# Patient Record
Sex: Female | Born: 1960 | Race: White | Hispanic: No | Smoking: Never smoker
Health system: Southern US, Community
[De-identification: ages and names within clinical notes are randomized; demographics above are authoritative.]

## PROBLEM LIST (undated history)

## (undated) DIAGNOSIS — N926 Irregular menstruation, unspecified: Secondary | ICD-10-CM

## (undated) DIAGNOSIS — E785 Hyperlipidemia, unspecified: Secondary | ICD-10-CM

## (undated) HISTORY — DX: Irregular menstruation, unspecified: N92.6

## (undated) HISTORY — DX: Hyperlipidemia, unspecified: E78.5

## (undated) HISTORY — PX: TONSILLECTOMY AND ADENOIDECTOMY: SHX28

## (undated) HISTORY — PX: REDUCTION MAMMAPLASTY: SUR839

## (undated) HISTORY — PX: BREAST LUMPECTOMY: SHX2

## (undated) HISTORY — PX: INCISIONAL BREAST BIOPSY: SHX1812

---

## 1998-06-13 ENCOUNTER — Other Ambulatory Visit: Admission: RE | Admit: 1998-06-13 | Discharge: 1998-06-13 | Payer: Self-pay | Admitting: Obstetrics & Gynecology

## 1999-05-21 ENCOUNTER — Ambulatory Visit (HOSPITAL_COMMUNITY): Admission: AD | Admit: 1999-05-21 | Discharge: 1999-05-21 | Payer: Self-pay | Admitting: Obstetrics & Gynecology

## 1999-05-21 ENCOUNTER — Encounter (INDEPENDENT_AMBULATORY_CARE_PROVIDER_SITE_OTHER): Payer: Self-pay | Admitting: Specialist

## 1999-06-11 ENCOUNTER — Other Ambulatory Visit: Admission: RE | Admit: 1999-06-11 | Discharge: 1999-06-11 | Payer: Self-pay | Admitting: Obstetrics & Gynecology

## 1999-08-06 ENCOUNTER — Encounter: Payer: Self-pay | Admitting: Obstetrics and Gynecology

## 1999-08-06 ENCOUNTER — Encounter: Admission: RE | Admit: 1999-08-06 | Discharge: 1999-08-06 | Payer: Self-pay | Admitting: Obstetrics and Gynecology

## 1999-09-23 ENCOUNTER — Encounter: Admission: RE | Admit: 1999-09-23 | Discharge: 1999-09-23 | Payer: Self-pay | Admitting: Obstetrics and Gynecology

## 1999-09-23 ENCOUNTER — Encounter: Payer: Self-pay | Admitting: Obstetrics and Gynecology

## 1999-11-08 ENCOUNTER — Encounter: Admission: RE | Admit: 1999-11-08 | Discharge: 1999-11-08 | Payer: Self-pay | Admitting: Gastroenterology

## 1999-11-08 ENCOUNTER — Encounter: Payer: Self-pay | Admitting: Gastroenterology

## 1999-11-11 ENCOUNTER — Encounter: Admission: RE | Admit: 1999-11-11 | Discharge: 1999-11-11 | Payer: Self-pay | Admitting: Gastroenterology

## 1999-11-11 ENCOUNTER — Encounter: Payer: Self-pay | Admitting: Gastroenterology

## 2000-05-04 ENCOUNTER — Other Ambulatory Visit: Admission: RE | Admit: 2000-05-04 | Discharge: 2000-05-04 | Payer: Self-pay | Admitting: Obstetrics and Gynecology

## 2000-08-25 ENCOUNTER — Encounter: Admission: RE | Admit: 2000-08-25 | Discharge: 2000-08-25 | Payer: Self-pay | Admitting: Obstetrics and Gynecology

## 2000-08-25 ENCOUNTER — Encounter: Payer: Self-pay | Admitting: Obstetrics and Gynecology

## 2001-03-12 ENCOUNTER — Encounter: Payer: Self-pay | Admitting: Obstetrics and Gynecology

## 2001-03-12 ENCOUNTER — Encounter: Admission: RE | Admit: 2001-03-12 | Discharge: 2001-03-12 | Payer: Self-pay | Admitting: Obstetrics and Gynecology

## 2001-04-20 ENCOUNTER — Ambulatory Visit (HOSPITAL_BASED_OUTPATIENT_CLINIC_OR_DEPARTMENT_OTHER): Admission: RE | Admit: 2001-04-20 | Discharge: 2001-04-20 | Payer: Self-pay | Admitting: *Deleted

## 2001-04-20 ENCOUNTER — Encounter (INDEPENDENT_AMBULATORY_CARE_PROVIDER_SITE_OTHER): Payer: Self-pay | Admitting: *Deleted

## 2001-07-21 ENCOUNTER — Encounter: Payer: Self-pay | Admitting: Obstetrics and Gynecology

## 2001-07-21 ENCOUNTER — Encounter: Admission: RE | Admit: 2001-07-21 | Discharge: 2001-07-21 | Payer: Self-pay | Admitting: Obstetrics and Gynecology

## 2001-08-24 ENCOUNTER — Other Ambulatory Visit: Admission: RE | Admit: 2001-08-24 | Discharge: 2001-08-24 | Payer: Self-pay | Admitting: Obstetrics and Gynecology

## 2001-09-10 ENCOUNTER — Ambulatory Visit (HOSPITAL_COMMUNITY): Admission: RE | Admit: 2001-09-10 | Discharge: 2001-09-10 | Payer: Self-pay | Admitting: Obstetrics and Gynecology

## 2001-09-10 ENCOUNTER — Encounter: Payer: Self-pay | Admitting: Obstetrics and Gynecology

## 2001-11-17 ENCOUNTER — Encounter: Payer: Self-pay | Admitting: Obstetrics and Gynecology

## 2001-11-17 ENCOUNTER — Encounter: Admission: RE | Admit: 2001-11-17 | Discharge: 2001-11-17 | Payer: Self-pay | Admitting: Obstetrics and Gynecology

## 2002-05-25 ENCOUNTER — Other Ambulatory Visit: Admission: RE | Admit: 2002-05-25 | Discharge: 2002-05-25 | Payer: Self-pay | Admitting: Obstetrics and Gynecology

## 2002-09-21 ENCOUNTER — Encounter: Admission: RE | Admit: 2002-09-21 | Discharge: 2002-09-21 | Payer: Self-pay | Admitting: Internal Medicine

## 2002-09-21 ENCOUNTER — Encounter: Payer: Self-pay | Admitting: Internal Medicine

## 2002-10-20 ENCOUNTER — Other Ambulatory Visit: Admission: RE | Admit: 2002-10-20 | Discharge: 2002-10-20 | Payer: Self-pay | Admitting: Gynecology

## 2003-06-17 ENCOUNTER — Emergency Department (HOSPITAL_COMMUNITY): Admission: EM | Admit: 2003-06-17 | Discharge: 2003-06-17 | Payer: Self-pay | Admitting: Emergency Medicine

## 2003-07-06 ENCOUNTER — Encounter: Admission: RE | Admit: 2003-07-06 | Discharge: 2003-07-06 | Payer: Self-pay | Admitting: Obstetrics and Gynecology

## 2003-08-15 ENCOUNTER — Encounter: Admission: RE | Admit: 2003-08-15 | Discharge: 2003-08-15 | Payer: Self-pay | Admitting: Obstetrics and Gynecology

## 2003-09-19 ENCOUNTER — Other Ambulatory Visit: Admission: RE | Admit: 2003-09-19 | Discharge: 2003-09-19 | Payer: Self-pay | Admitting: Obstetrics and Gynecology

## 2003-10-06 ENCOUNTER — Ambulatory Visit (HOSPITAL_BASED_OUTPATIENT_CLINIC_OR_DEPARTMENT_OTHER): Admission: RE | Admit: 2003-10-06 | Discharge: 2003-10-06 | Payer: Self-pay | Admitting: *Deleted

## 2003-10-06 ENCOUNTER — Encounter (INDEPENDENT_AMBULATORY_CARE_PROVIDER_SITE_OTHER): Payer: Self-pay | Admitting: *Deleted

## 2003-10-06 ENCOUNTER — Ambulatory Visit (HOSPITAL_COMMUNITY): Admission: RE | Admit: 2003-10-06 | Discharge: 2003-10-06 | Payer: Self-pay | Admitting: *Deleted

## 2004-07-19 ENCOUNTER — Encounter: Admission: RE | Admit: 2004-07-19 | Discharge: 2004-07-19 | Payer: Self-pay | Admitting: Gynecology

## 2004-07-26 ENCOUNTER — Encounter: Admission: RE | Admit: 2004-07-26 | Discharge: 2004-07-26 | Payer: Self-pay | Admitting: Gynecology

## 2004-07-29 ENCOUNTER — Encounter: Admission: RE | Admit: 2004-07-29 | Discharge: 2004-07-29 | Payer: Self-pay | Admitting: Gynecology

## 2004-10-24 ENCOUNTER — Ambulatory Visit: Payer: Self-pay | Admitting: Internal Medicine

## 2004-10-28 ENCOUNTER — Ambulatory Visit: Payer: Self-pay | Admitting: Internal Medicine

## 2004-11-20 ENCOUNTER — Other Ambulatory Visit: Admission: RE | Admit: 2004-11-20 | Discharge: 2004-11-20 | Payer: Self-pay | Admitting: Obstetrics and Gynecology

## 2005-01-14 ENCOUNTER — Encounter: Admission: RE | Admit: 2005-01-14 | Discharge: 2005-01-14 | Payer: Self-pay | Admitting: Gynecology

## 2005-07-22 ENCOUNTER — Encounter: Admission: RE | Admit: 2005-07-22 | Discharge: 2005-07-22 | Payer: Self-pay | Admitting: Internal Medicine

## 2005-10-21 ENCOUNTER — Other Ambulatory Visit: Admission: RE | Admit: 2005-10-21 | Discharge: 2005-10-21 | Payer: Self-pay | Admitting: Obstetrics and Gynecology

## 2005-11-19 ENCOUNTER — Ambulatory Visit: Payer: Self-pay | Admitting: Internal Medicine

## 2005-12-02 ENCOUNTER — Ambulatory Visit: Payer: Self-pay | Admitting: Internal Medicine

## 2005-12-12 ENCOUNTER — Encounter: Admission: RE | Admit: 2005-12-12 | Discharge: 2005-12-12 | Payer: Self-pay | Admitting: Obstetrics and Gynecology

## 2006-10-27 ENCOUNTER — Ambulatory Visit: Payer: Self-pay | Admitting: Internal Medicine

## 2006-10-27 LAB — CONVERTED CEMR LAB
ALT: 18 units/L (ref 0–40)
AST: 23 units/L (ref 0–37)
Albumin: 4.6 g/dL (ref 3.5–5.2)
Alkaline Phosphatase: 38 units/L — ABNORMAL LOW (ref 39–117)
Amylase: 76 units/L (ref 27–131)
Basophils Absolute: 0.1 10*3/uL (ref 0.0–0.1)
Basophils Relative: 1.1 % — ABNORMAL HIGH (ref 0.0–1.0)
Bilirubin, Direct: 0.1 mg/dL (ref 0.0–0.3)
Eosinophils Absolute: 0.1 10*3/uL (ref 0.0–0.6)
Eosinophils Relative: 2.2 % (ref 0.0–5.0)
Free T4: 0.7 ng/dL (ref 0.6–1.6)
HCT: 43.8 % (ref 36.0–46.0)
Hemoglobin: 14.8 g/dL (ref 12.0–15.0)
Lipase: 34 units/L (ref 11.0–59.0)
Lymphocytes Relative: 30.3 % (ref 12.0–46.0)
MCHC: 33.7 g/dL (ref 30.0–36.0)
MCV: 96.7 fL (ref 78.0–100.0)
Monocytes Absolute: 0.2 10*3/uL (ref 0.2–0.7)
Monocytes Relative: 4.5 % (ref 3.0–11.0)
Neutro Abs: 2.9 10*3/uL (ref 1.4–7.7)
Neutrophils Relative %: 61.9 % (ref 43.0–77.0)
Platelets: 259 10*3/uL (ref 150–400)
RBC: 4.53 M/uL (ref 3.87–5.11)
RDW: 12.6 % (ref 11.5–14.6)
T3, Free: 3.6 pg/mL (ref 2.3–4.2)
TSH: 1.34 microintl units/mL (ref 0.35–5.50)
Total Bilirubin: 0.7 mg/dL (ref 0.3–1.2)
Total Protein: 7.4 g/dL (ref 6.0–8.3)
WBC: 4.7 10*3/uL (ref 4.5–10.5)

## 2006-11-02 ENCOUNTER — Encounter: Admission: RE | Admit: 2006-11-02 | Discharge: 2006-11-02 | Payer: Self-pay | Admitting: Obstetrics and Gynecology

## 2006-11-06 ENCOUNTER — Encounter: Admission: RE | Admit: 2006-11-06 | Discharge: 2006-11-06 | Payer: Self-pay | Admitting: Internal Medicine

## 2006-12-02 ENCOUNTER — Ambulatory Visit: Payer: Self-pay | Admitting: Internal Medicine

## 2007-10-15 ENCOUNTER — Telehealth (INDEPENDENT_AMBULATORY_CARE_PROVIDER_SITE_OTHER): Payer: Self-pay | Admitting: *Deleted

## 2007-10-19 ENCOUNTER — Ambulatory Visit: Payer: Self-pay | Admitting: Internal Medicine

## 2007-11-27 IMAGING — US US ABDOMEN COMPLETE
1 series · 14 of 25 positions shown · non-contrast
Comparison: none

CLINICAL DATA: Mid to lower abdominal pain.
 ABDOMEN ULTRASOUND:
TECHNIQUE: Complete abdominal ultrasound examination was performed including evaluation of the liver, gallbladder, bile ducts, pancreas, kidneys, spleen, IVC, and abdominal aorta.
 The gallbladder is well seen and no gallstones are noted.  The liver has a normal echogenic pattern.  The common bile duct is normal measuring 3.5 mm in diameter.  The IVC, pancreas, and spleen are normal.  No hydronephrosis is noted.  The right kidney measures 13.2 cm sagittally with the left kidney measuring 12.7 cm.  A small echogenic focus in the upper pole of the right kidney is consistent with an 8 mm angiomyolipoma.  The abdominal aorta is normal in caliber.

[Series 1: unknown · 0.20mm/px · 14 of 95 slices shown]
[im 1/95]
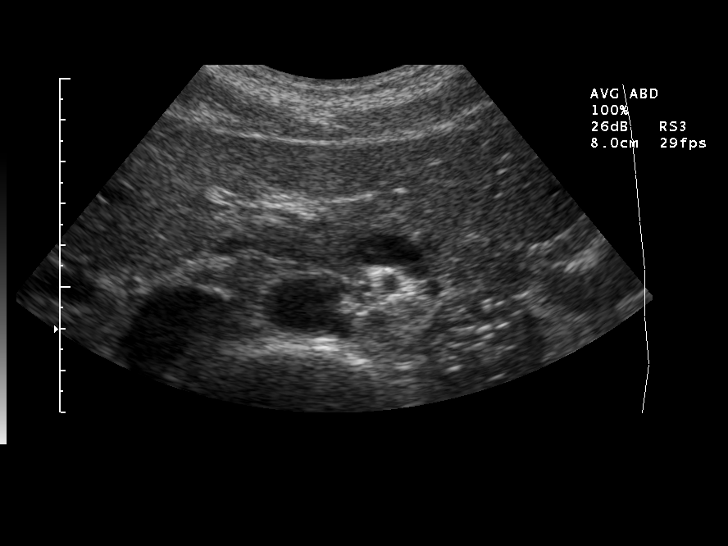
[im 8/95]
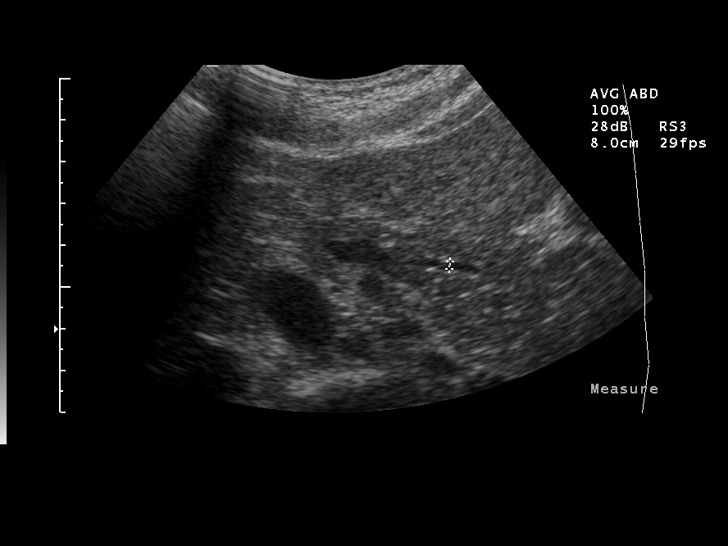
[im 16/95]
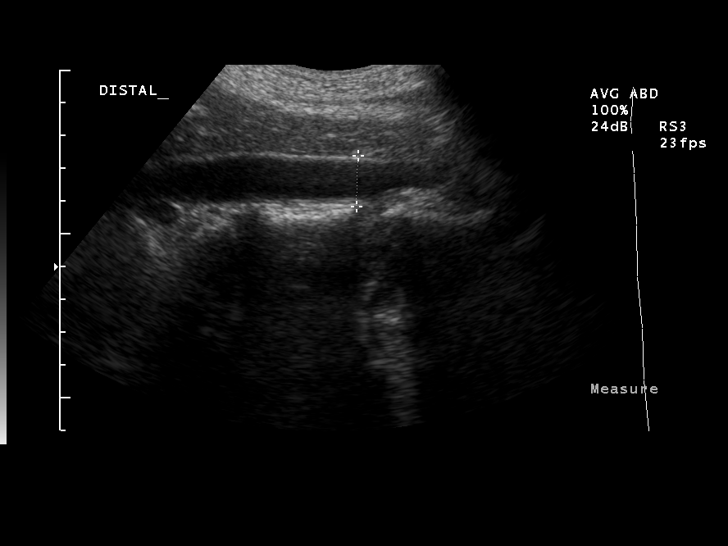
[im 24/95]
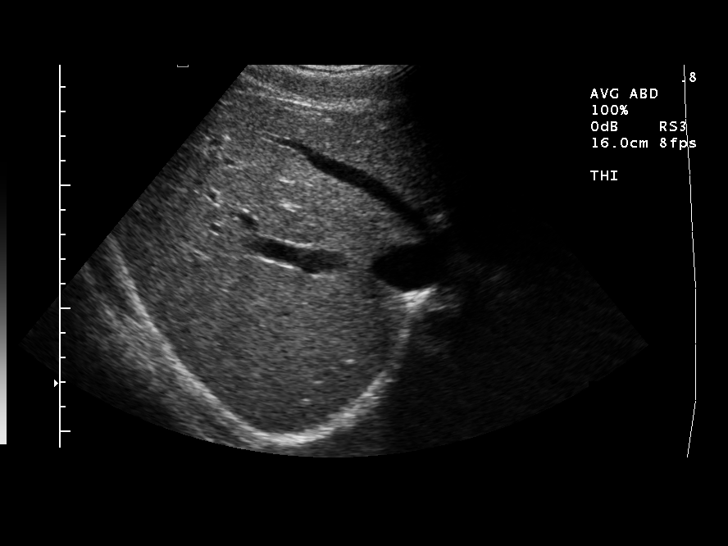
[im 32/95]
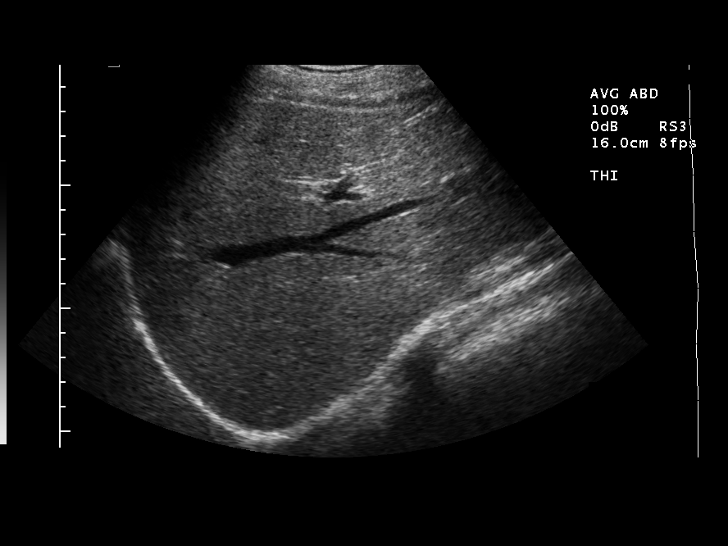
[im 36/95]
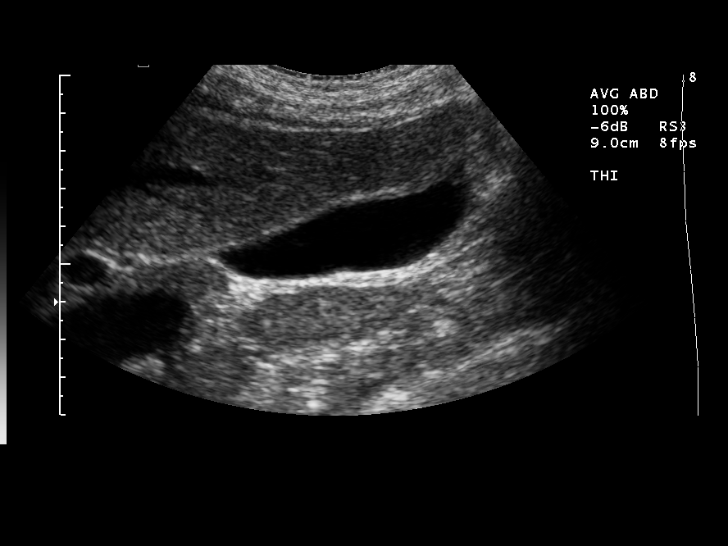
[im 44/95]
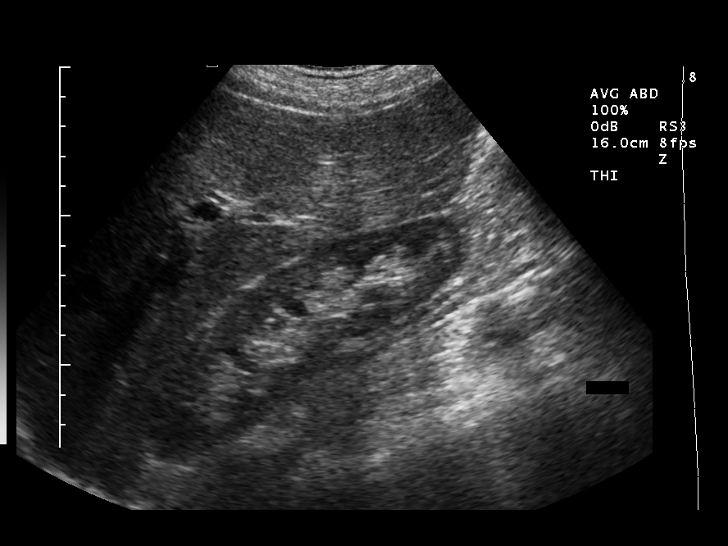
[im 51/95]
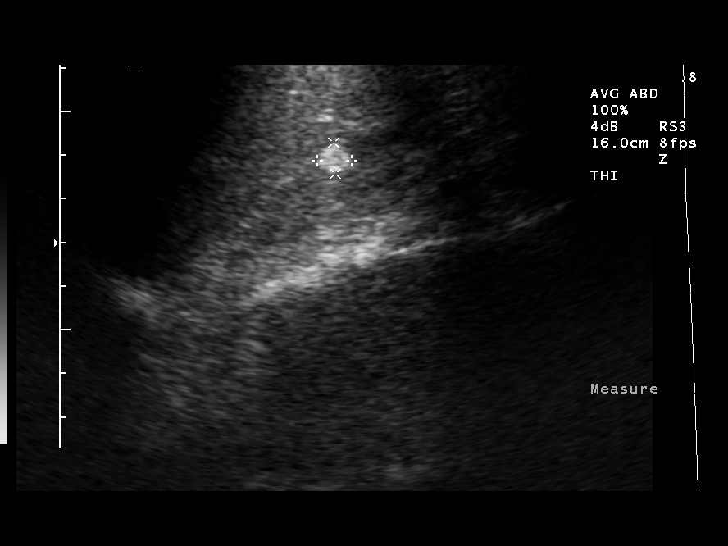
[im 59/95]
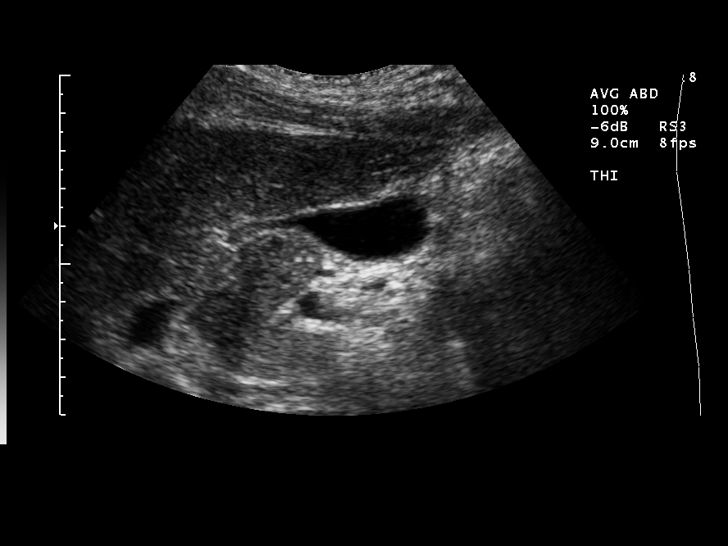
[im 63/95]
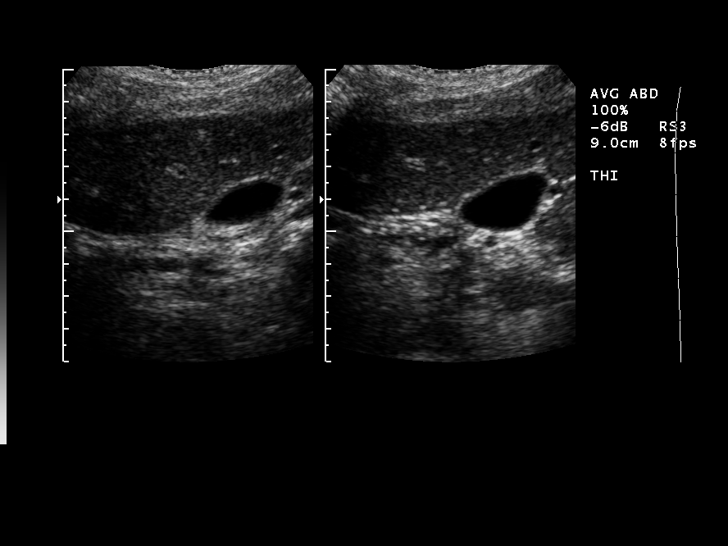
[im 71/95]
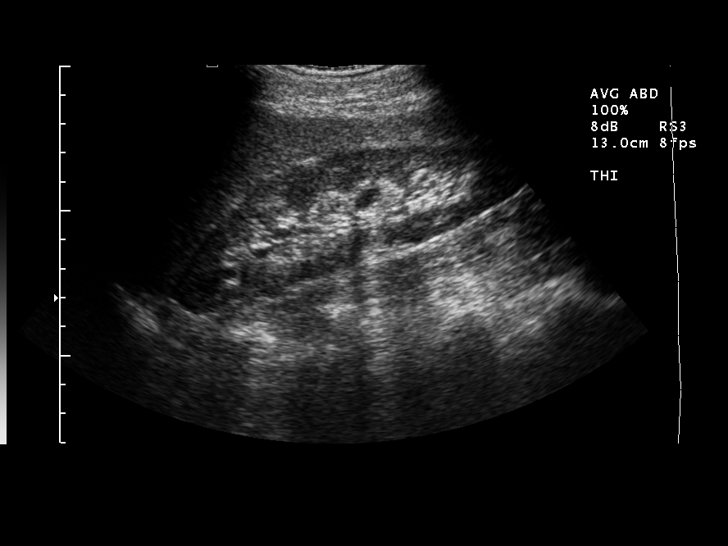
[im 79/95]
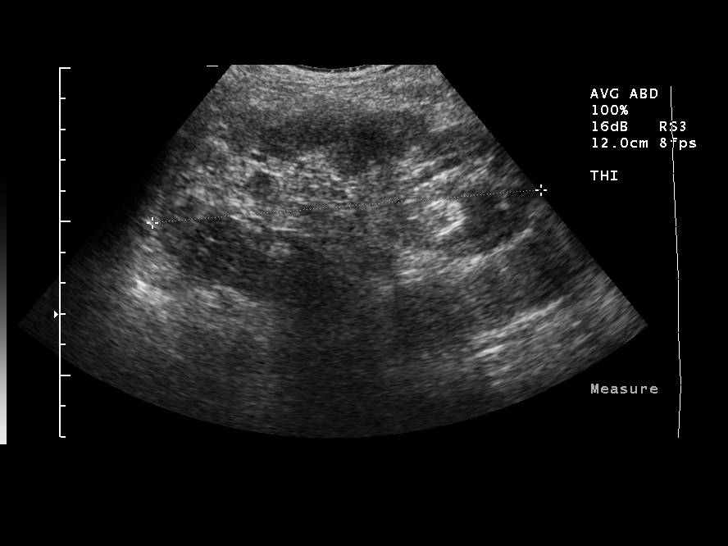
[im 87/95]
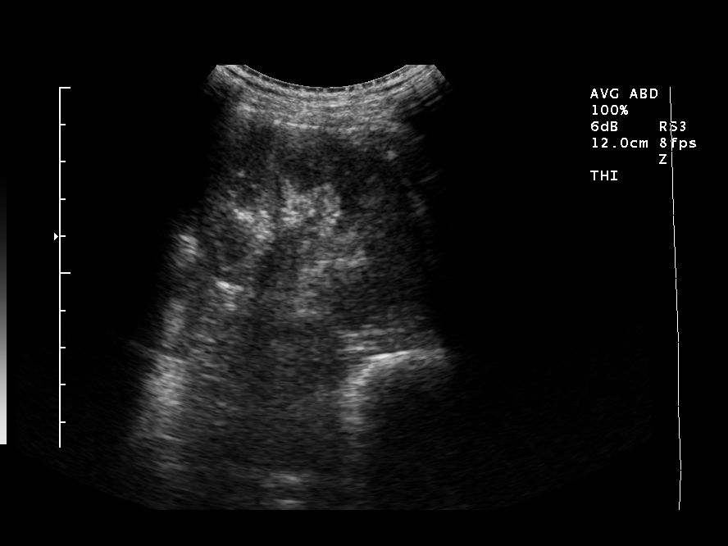
[im 95/95]
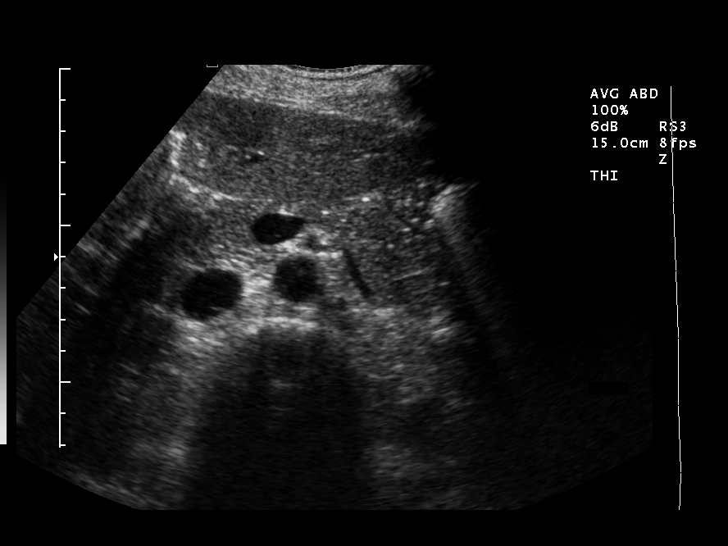

[14 of 25 positions shown; findings below may reference images not displayed]

IMPRESSION: 1.  No gallstones.
 2.  8 mm right upper pole renal angiomyolipoma.

## 2007-12-03 ENCOUNTER — Telehealth (INDEPENDENT_AMBULATORY_CARE_PROVIDER_SITE_OTHER): Payer: Self-pay | Admitting: *Deleted

## 2007-12-14 ENCOUNTER — Ambulatory Visit: Payer: Self-pay | Admitting: Internal Medicine

## 2007-12-14 DIAGNOSIS — Z9089 Acquired absence of other organs: Secondary | ICD-10-CM

## 2007-12-14 DIAGNOSIS — N926 Irregular menstruation, unspecified: Secondary | ICD-10-CM

## 2007-12-14 DIAGNOSIS — N939 Abnormal uterine and vaginal bleeding, unspecified: Secondary | ICD-10-CM

## 2007-12-14 DIAGNOSIS — Z9189 Other specified personal risk factors, not elsewhere classified: Secondary | ICD-10-CM | POA: Insufficient documentation

## 2007-12-14 LAB — CONVERTED CEMR LAB
Bilirubin Urine: NEGATIVE
Glucose, Urine, Semiquant: NEGATIVE
Ketones, urine, test strip: NEGATIVE
Nitrite: NEGATIVE
Protein, U semiquant: NEGATIVE
Specific Gravity, Urine: 1.01
Urobilinogen, UA: NEGATIVE
WBC Urine, dipstick: NEGATIVE
pH: 5

## 2008-02-10 ENCOUNTER — Encounter: Admission: RE | Admit: 2008-02-10 | Discharge: 2008-02-10 | Payer: Self-pay | Admitting: Obstetrics and Gynecology

## 2008-03-08 ENCOUNTER — Ambulatory Visit: Payer: Self-pay | Admitting: Internal Medicine

## 2008-03-08 DIAGNOSIS — E785 Hyperlipidemia, unspecified: Secondary | ICD-10-CM

## 2008-03-15 ENCOUNTER — Encounter (INDEPENDENT_AMBULATORY_CARE_PROVIDER_SITE_OTHER): Payer: Self-pay | Admitting: *Deleted

## 2008-04-06 ENCOUNTER — Other Ambulatory Visit: Admission: RE | Admit: 2008-04-06 | Discharge: 2008-04-06 | Payer: Self-pay | Admitting: Obstetrics and Gynecology

## 2008-07-18 ENCOUNTER — Telehealth (INDEPENDENT_AMBULATORY_CARE_PROVIDER_SITE_OTHER): Payer: Self-pay | Admitting: *Deleted

## 2009-01-29 ENCOUNTER — Telehealth (INDEPENDENT_AMBULATORY_CARE_PROVIDER_SITE_OTHER): Payer: Self-pay | Admitting: *Deleted

## 2009-01-31 ENCOUNTER — Telehealth (INDEPENDENT_AMBULATORY_CARE_PROVIDER_SITE_OTHER): Payer: Self-pay | Admitting: *Deleted

## 2009-02-06 ENCOUNTER — Ambulatory Visit: Payer: Self-pay | Admitting: Internal Medicine

## 2009-02-06 DIAGNOSIS — L509 Urticaria, unspecified: Secondary | ICD-10-CM

## 2009-02-07 ENCOUNTER — Telehealth: Payer: Self-pay | Admitting: Internal Medicine

## 2009-02-08 ENCOUNTER — Telehealth (INDEPENDENT_AMBULATORY_CARE_PROVIDER_SITE_OTHER): Payer: Self-pay | Admitting: *Deleted

## 2009-08-21 ENCOUNTER — Telehealth: Payer: Self-pay | Admitting: Internal Medicine

## 2009-12-13 ENCOUNTER — Other Ambulatory Visit: Admission: RE | Admit: 2009-12-13 | Discharge: 2009-12-13 | Payer: Self-pay | Admitting: Obstetrics and Gynecology

## 2009-12-18 ENCOUNTER — Emergency Department (HOSPITAL_COMMUNITY): Admission: EM | Admit: 2009-12-18 | Discharge: 2009-12-18 | Payer: Self-pay | Admitting: Emergency Medicine

## 2009-12-21 ENCOUNTER — Emergency Department (HOSPITAL_COMMUNITY): Admission: EM | Admit: 2009-12-21 | Discharge: 2009-12-21 | Payer: Self-pay | Admitting: Emergency Medicine

## 2010-09-28 ENCOUNTER — Encounter: Payer: Self-pay | Admitting: Gynecology

## 2010-09-29 ENCOUNTER — Encounter: Payer: Self-pay | Admitting: Orthopedic Surgery

## 2010-09-30 ENCOUNTER — Encounter: Payer: Self-pay | Admitting: Obstetrics and Gynecology

## 2010-10-06 LAB — CONVERTED CEMR LAB
ALT: 16 units/L (ref 0–35)
AST: 23 units/L (ref 0–37)
Albumin: 4.4 g/dL (ref 3.5–5.2)
Alkaline Phosphatase: 47 units/L (ref 39–117)
BUN: 11 mg/dL (ref 6–23)
Basophils Absolute: 0 10*3/uL (ref 0.0–0.1)
Basophils Relative: 0.1 % (ref 0.0–1.0)
Bilirubin, Direct: 0.1 mg/dL (ref 0.0–0.3)
CO2: 29 meq/L (ref 19–32)
Calcium: 9.2 mg/dL (ref 8.4–10.5)
Chloride: 103 meq/L (ref 96–112)
Cholesterol, target level: 200 mg/dL
Cholesterol: 201 mg/dL (ref 0–200)
Creatinine, Ser: 0.6 mg/dL (ref 0.4–1.2)
Direct LDL: 85.8 mg/dL
Eosinophils Absolute: 0.1 10*3/uL (ref 0.0–0.7)
Eosinophils Relative: 2.4 % (ref 0.0–5.0)
GFR calc Af Amer: 138 mL/min
GFR calc non Af Amer: 114 mL/min
Glucose, Bld: 85 mg/dL (ref 70–99)
HCT: 41.2 % (ref 36.0–46.0)
HDL goal, serum: 50 mg/dL
HDL: 103.2 mg/dL (ref >39.0)
Hemoglobin: 14.5 g/dL (ref 12.0–15.0)
LDL Goal: 150 mg/dL
Lymphocytes Relative: 30.3 % (ref 12.0–46.0)
MCHC: 35.1 g/dL (ref 30.0–36.0)
MCV: 97.8 fL (ref 78.0–100.0)
Monocytes Absolute: 0.4 10*3/uL (ref 0.1–1.0)
Monocytes Relative: 10 % (ref 3.0–12.0)
Neutro Abs: 2.9 10*3/uL (ref 1.4–7.7)
Neutrophils Relative %: 57.2 % (ref 43.0–77.0)
Platelets: 236 10*3/uL (ref 150–400)
Potassium: 3.8 meq/L (ref 3.5–5.1)
RBC: 4.21 M/uL (ref 3.87–5.11)
RDW: 12.8 % (ref 11.5–14.6)
Sodium: 139 meq/L (ref 135–145)
TSH: 0.75 microintl units/mL (ref 0.35–5.50)
Total Bilirubin: 0.9 mg/dL (ref 0.3–1.2)
Total CHOL/HDL Ratio: 1.9
Total Protein: 7.1 g/dL (ref 6.0–8.3)
Triglycerides: 59 mg/dL (ref 0–149)
VLDL: 12 mg/dL (ref 0–40)
WBC: 5 10*3/uL (ref 4.5–10.5)

## 2010-10-17 ENCOUNTER — Other Ambulatory Visit: Payer: Self-pay | Admitting: Internal Medicine

## 2010-10-17 DIAGNOSIS — Z1231 Encounter for screening mammogram for malignant neoplasm of breast: Secondary | ICD-10-CM

## 2010-10-23 ENCOUNTER — Ambulatory Visit
Admission: RE | Admit: 2010-10-23 | Discharge: 2010-10-23 | Disposition: A | Discharge status: Home / Self Care | Payer: BC Managed Care – PPO | Source: Ambulatory Visit | Attending: Internal Medicine | Admitting: Internal Medicine

## 2010-10-23 DIAGNOSIS — Z1231 Encounter for screening mammogram for malignant neoplasm of breast: Secondary | ICD-10-CM

## 2010-10-29 ENCOUNTER — Other Ambulatory Visit: Payer: Self-pay | Admitting: Internal Medicine

## 2010-10-29 DIAGNOSIS — R928 Other abnormal and inconclusive findings on diagnostic imaging of breast: Secondary | ICD-10-CM

## 2010-11-01 ENCOUNTER — Encounter: Payer: Self-pay | Admitting: Internal Medicine

## 2010-11-02 ENCOUNTER — Encounter: Payer: Self-pay | Admitting: Internal Medicine

## 2010-11-04 ENCOUNTER — Other Ambulatory Visit: Payer: Self-pay | Admitting: Internal Medicine

## 2010-11-04 ENCOUNTER — Ambulatory Visit
Admission: RE | Admit: 2010-11-04 | Discharge: 2010-11-04 | Disposition: A | Discharge status: Home / Self Care | Payer: BC Managed Care – PPO | Source: Ambulatory Visit | Attending: Internal Medicine | Admitting: Internal Medicine

## 2010-11-04 ENCOUNTER — Other Ambulatory Visit: Payer: BC Managed Care – PPO

## 2010-11-04 DIAGNOSIS — R928 Other abnormal and inconclusive findings on diagnostic imaging of breast: Secondary | ICD-10-CM

## 2010-11-04 DIAGNOSIS — N6320 Unspecified lump in the left breast, unspecified quadrant: Secondary | ICD-10-CM

## 2010-11-05 ENCOUNTER — Encounter: Payer: Self-pay | Admitting: Internal Medicine

## 2010-11-05 ENCOUNTER — Other Ambulatory Visit: Payer: Self-pay | Admitting: Radiology

## 2010-11-05 ENCOUNTER — Ambulatory Visit
Admission: RE | Admit: 2010-11-05 | Discharge: 2010-11-05 | Disposition: A | Discharge status: Home / Self Care | Payer: BC Managed Care – PPO | Source: Ambulatory Visit | Attending: Internal Medicine | Admitting: Internal Medicine

## 2010-11-05 DIAGNOSIS — R928 Other abnormal and inconclusive findings on diagnostic imaging of breast: Secondary | ICD-10-CM

## 2010-11-05 DIAGNOSIS — N6320 Unspecified lump in the left breast, unspecified quadrant: Secondary | ICD-10-CM

## 2010-12-17 ENCOUNTER — Ambulatory Visit (INDEPENDENT_AMBULATORY_CARE_PROVIDER_SITE_OTHER): Payer: BC Managed Care – PPO | Admitting: Internal Medicine

## 2010-12-17 ENCOUNTER — Encounter: Payer: Self-pay | Admitting: Internal Medicine

## 2010-12-17 DIAGNOSIS — E785 Hyperlipidemia, unspecified: Secondary | ICD-10-CM

## 2010-12-17 DIAGNOSIS — Z0000 Encounter for general adult medical examination without abnormal findings: Secondary | ICD-10-CM

## 2010-12-17 DIAGNOSIS — L509 Urticaria, unspecified: Secondary | ICD-10-CM

## 2010-12-17 NOTE — Assessment & Plan Note (Addendum)
Due to high HDL. NMR Lipoprofile 2008: LDL 122 (1060/656), HDL 122, TG 55. LDL goal = < 150, ideally < 120.

## 2010-12-17 NOTE — Assessment & Plan Note (Signed)
?   Due to cashews

## 2010-12-17 NOTE — Patient Instructions (Addendum)
Preventive Health Care: Exercise  30-45  minutes a day, 3-4 days a week. Walking is especially valuable in preventing Osteoporosis. Eat a low-fat diet with lots of fruits and vegetables, up to 7-9 servings per day. Avoid obesity; your goal = waist less than 35 inches.Consume less than 30 grams of sugar per day from foods & drinks with High Fructose Corn Syrup as #2,3 or #4 on label. Eye Doctor - have an eye exam @ least annually. Depression is common in our stressful world. If you're feeling down or losing interest in things you normally enjoy, please call. Please schedule fasting labs:BMET, CBC & dif, hepatic panel, TSH, Lipid panel( Codes: V70.0, 272.4)

## 2010-12-17 NOTE — Progress Notes (Signed)
  Subjective:    Patient ID: Morgan Burch, female    DOB: 1961/02/25, 50 y.o.   MRN: [id number]  HPI Morgan Burch is here for a physical; she has had a benign lesion resected from L breast recently.    Review of Systems Patient reports no  hearing  changes, adenopathy,fever, weight change,  persistant / recurrent hoarseness , swallowing issues, chest pain,palpitations,edema,persistant /recurrent cough, hemoptysis, dyspnea( rest/ exertional/paroxysmal nocturnal), gastrointestinal bleeding(melena, rectal bleeding), abdominal pain, significant heartburn bowel changes,GU symptoms(dysuria, hematuria,pyuria ), Gyn symptoms(abnormal  bleeding , pain),  syncope, focal weakness, memory loss,numbness & tingling, skin/hair /nail changes,abnormal bruising or bleeding, anxiety,or depression. Some visual acuity changes.  Intermittent low back pain ; responsive to soaks.     Objective:   Physical Exam Gen: Thin but  well-nourished in appearance. Alert, appropriate and cooperative throughout exam. Head: Normocephalic without obvious abnormalities. Eyes: No corneal or conjunctival inflammation noted. Pupils equal round reactive to light and accommodation. Fundal exam is benign without hemorrhages, exudate, papilledema. Extraocular motion intact. Vision grossly normal. Ears: External  ear exam reveals no significant lesions or deformities. Canals  clear.TMs normal. Hearing is grossly normal bilaterally. Nose: External nasal exam reveals no deformity or inflammation. Nasal mucosa are pink and moist. No lesions or exudates noted. Septum  w/o deviation.  Mouth: Oral mucosa and oropharynx reveal no lesions or exudates. Teeth in good repair. Neck: No deformities, masses, or tenderness noted. Range of motion normal. Thyroid normal w/o nodules or enlargement. Lungs: Normal respiratory effort; chest expands symmetrically. Lungs are clear to auscultation without rales, wheezes, or increased work of breathing. Heart: Normal  rate and rhythm. Normal S1 and S2. No gallop, click, or rub. No  murmur. Abdomen: Bowel sounds normal; abdomen soft and nontender. No masses, organomegaly or hernias noted. Musculoskeletal/extremities: No deformity or scoliosis noted of  the thoracic or lumbar spine. No clubbing, cyanosis, edema, or deformity noted. Range of motion normal .Tone & strength  None .Joints none . Nail health normal.. Vascular: Carotid, radial artery, dorsalis pedis and dorsalis posterior tibial pulses are full and equal. No bruits present. Neurologic: Alert and oriented x3. Deep tendon reflexes symmetrical and normal.  Skin: Intact without suspicious lesions or rashes.  Lymph: No cervical or  axillary  lymphadenopathy present.  Psych: Mood and affect are normal; no evidence of anxiety or depression.          Assessment & Plan:  #1 Wellness Exam , no new issues

## 2010-12-19 ENCOUNTER — Encounter: Payer: Self-pay | Admitting: Internal Medicine

## 2010-12-20 ENCOUNTER — Other Ambulatory Visit (INDEPENDENT_AMBULATORY_CARE_PROVIDER_SITE_OTHER): Payer: BC Managed Care – PPO

## 2010-12-20 DIAGNOSIS — Z0000 Encounter for general adult medical examination without abnormal findings: Secondary | ICD-10-CM

## 2010-12-20 DIAGNOSIS — E785 Hyperlipidemia, unspecified: Secondary | ICD-10-CM

## 2010-12-20 LAB — HEPATIC FUNCTION PANEL
ALT: 16 U/L (ref 0–35)
AST: 21 U/L (ref 0–37)
Albumin: 4.3 g/dL (ref 3.5–5.2)
Total Bilirubin: 0.4 mg/dL (ref 0.3–1.2)
Total Protein: 6.7 g/dL (ref 6.0–8.3)

## 2010-12-20 LAB — BASIC METABOLIC PANEL
BUN: 11 mg/dL (ref 6–23)
Chloride: 102 mEq/L (ref 96–112)
Potassium: 4.8 mEq/L (ref 3.5–5.1)
Sodium: 141 mEq/L (ref 135–145)

## 2010-12-20 LAB — CBC WITH DIFFERENTIAL/PLATELET
Basophils Relative: 0.6 % (ref 0.0–3.0)
Eosinophils Relative: 4.7 % (ref 0.0–5.0)
HCT: 41.8 % (ref 36.0–46.0)
Hemoglobin: 14.2 g/dL (ref 12.0–15.0)
Lymphs Abs: 1.8 10*3/uL (ref 0.7–4.0)
MCV: 98.4 fl (ref 78.0–100.0)
Monocytes Absolute: 0.4 10*3/uL (ref 0.1–1.0)
Monocytes Relative: 8.9 % (ref 3.0–12.0)
Neutro Abs: 2.3 10*3/uL (ref 1.4–7.7)
Platelets: 235 10*3/uL (ref 150.0–400.0)
WBC: 4.8 10*3/uL (ref 4.5–10.5)

## 2010-12-20 LAB — TSH: TSH: 1.23 u[IU]/mL (ref 0.35–5.50)

## 2010-12-20 LAB — LIPID PANEL: Cholesterol: 214 mg/dL — ABNORMAL HIGH (ref 0–200)

## 2010-12-27 ENCOUNTER — Telehealth: Payer: Self-pay | Admitting: *Deleted

## 2010-12-27 NOTE — Telephone Encounter (Signed)
Pt called would like for Hop to give her a call received copy of labs and noticed that lymphocytes has increased within the last five years and is concerned.

## 2011-01-14 ENCOUNTER — Emergency Department (HOSPITAL_COMMUNITY): Payer: BC Managed Care – PPO

## 2011-01-14 ENCOUNTER — Emergency Department (HOSPITAL_COMMUNITY)
Admission: EM | Admit: 2011-01-14 | Discharge: 2011-01-14 | Disposition: A | Discharge status: Home / Self Care | Payer: BC Managed Care – PPO | Attending: Emergency Medicine | Admitting: Emergency Medicine

## 2011-01-14 DIAGNOSIS — S20229A Contusion of unspecified back wall of thorax, initial encounter: Secondary | ICD-10-CM | POA: Insufficient documentation

## 2011-01-14 DIAGNOSIS — M25579 Pain in unspecified ankle and joints of unspecified foot: Secondary | ICD-10-CM | POA: Insufficient documentation

## 2011-01-14 DIAGNOSIS — S93409A Sprain of unspecified ligament of unspecified ankle, initial encounter: Secondary | ICD-10-CM | POA: Insufficient documentation

## 2011-01-14 DIAGNOSIS — M542 Cervicalgia: Secondary | ICD-10-CM | POA: Insufficient documentation

## 2011-01-14 DIAGNOSIS — R0602 Shortness of breath: Secondary | ICD-10-CM | POA: Insufficient documentation

## 2011-01-14 DIAGNOSIS — S335XXA Sprain of ligaments of lumbar spine, initial encounter: Secondary | ICD-10-CM | POA: Insufficient documentation

## 2011-01-14 DIAGNOSIS — M549 Dorsalgia, unspecified: Secondary | ICD-10-CM | POA: Insufficient documentation

## 2011-01-14 DIAGNOSIS — M546 Pain in thoracic spine: Secondary | ICD-10-CM | POA: Insufficient documentation

## 2011-01-14 DIAGNOSIS — Y92009 Unspecified place in unspecified non-institutional (private) residence as the place of occurrence of the external cause: Secondary | ICD-10-CM | POA: Insufficient documentation

## 2011-01-14 DIAGNOSIS — W108XXA Fall (on) (from) other stairs and steps, initial encounter: Secondary | ICD-10-CM | POA: Insufficient documentation

## 2011-01-14 DIAGNOSIS — M545 Low back pain, unspecified: Secondary | ICD-10-CM | POA: Insufficient documentation

## 2011-01-14 DIAGNOSIS — R079 Chest pain, unspecified: Secondary | ICD-10-CM | POA: Insufficient documentation

## 2011-01-21 NOTE — Assessment & Plan Note (Signed)
[facility]                                 ON-CALL NOTE   NAME:Morgan Burch, Morgan Burch                  MRN:          [id number]  DATE:01/27/2009                            DOB:          [birth year]    Time 3:55 pm She sees Dr. Alwyn Ren. Phone#  [phone]  The patient states that she has had chickenpox for the last 7 days and  asks for my advice.  She has never been particularly sick with it.  She  has had no cough, no fever what started as a few itchy small bumps on  her body is now generalized over most of her body.  She has been taking  Benadryl for the itching and did not think there was anything else to  do.  She spoke to her pharmacist who suggested she call her doctor.  I  told her in fact it was a little too late for any type of antiviral  treatment to be effective.  She could continue Benadryl and she could  use Aveeno soaps in the bathtub for itching.  She can follow up as  needed.     Tera Mater. Clent Ridges, MD  Electronically Signed    SAF/MedQ  DD: 01/27/2009  DT: 01/28/2009  Job #: [id number]

## 2011-01-24 NOTE — Op Note (Signed)
[place]  Patient:    Morgan Burch, Morgan Burch                      MRN: [id number] Proc. Date: 04/20/01 Attending:  Vikki Ports, M.D.                           Operative Report  PREOPERATIVE DIAGNOSIS: Left breast mass, status post reduction mammoplasty.  POSTOPERATIVE DIAGNOSIS: Left breast mass, status post reduction mammoplasty.  OPERATION/PROCEDURE: Excisional left breast biopsy.  SURGEON: Vikki Ports, M.D.  ANESTHESIA: Local MAC.  DESCRIPTION OF PROCEDURE: The patient was taken to the operating room and placed in the supine position.  After adequate anesthesia was induced using MAC technique the left breast was prepped and draped in the normal sterile fashion.  Using the previous vertical incision extending from 6 oclock in the periareolar region I dissected inferior and laterally onto palpable firm nodularity.  This was excised in its entirety and sent for pathologic evaluation.  There were other areas of fibrous tissue but no more nodularity. Adequate hemostasis was assured.  The skin was closed with a 4-0 subcuticular Monocryl and Steri-Strips and a sterile dressing applied.  The patient tolerated the procedure well and went to the PACU in good condition. DD:  04/20/01 TD:  04/20/01 Job: [id number]

## 2011-01-24 NOTE — Op Note (Signed)
NAME:  JENNYLEE, UEHARA                       ACCOUNT NO.:  [id number]   MEDICAL RECORD NO.:  [id number]                   PATIENT TYPE:  AMB   LOCATION:  DSC                                  FACILITY:  [facility]   PHYSICIAN:  Vikki Ports, M.D.         DATE OF BIRTH:  [birth year]   DATE OF PROCEDURE:  10/06/2003  DATE OF DISCHARGE:                                 OPERATIVE REPORT   PREOPERATIVE DIAGNOSIS:  Left breast mass.   POSTOPERATIVE DIAGNOSIS:  Left breast mass.   PROCEDURE:  Excisional left breast biopsy.   SURGEON:  Vikki Ports, M.D.   DESCRIPTION OF PROCEDURE:  The patient was taken to the operating room and  placed in a supine position.  After adequate anesthesia was induced using  MAC technique, the left breast was prepped and draped in the normal sterile  fashion.  Using 1% lidocaine local anesthesia, the skin overlying the mass  in the 6 o'clock periareolar region was anesthetized.  Using the previous  reduction mammoplasty incision, a vertical incision was made to the areola.  I dissected down through the subcutaneous tissue onto a rather fibrotic  scarred mass.  This was rather ill defined but excised in its entirety and  sent for pathologic evaluation.  Adequate hemostasis was insured.  The skin  was closed with subcuticular 4-0 Monocryl.  Steri-Strips and sterile  dressings were applied.  The patient tolerated the procedure well and went  to the PACU in good condition.                                               Vikki Ports, M.D.    KRH/MEDQ  D:  10/06/2003  T:  10/06/2003  Job:  [id number]

## 2011-03-30 ENCOUNTER — Emergency Department (HOSPITAL_COMMUNITY): Payer: BC Managed Care – PPO

## 2011-03-30 ENCOUNTER — Emergency Department (HOSPITAL_COMMUNITY)
Admission: EM | Admit: 2011-03-30 | Discharge: 2011-03-30 | Disposition: A | Discharge status: Home / Self Care | Payer: BC Managed Care – PPO | Attending: Emergency Medicine | Admitting: Emergency Medicine

## 2011-03-30 DIAGNOSIS — F1010 Alcohol abuse, uncomplicated: Secondary | ICD-10-CM | POA: Insufficient documentation

## 2011-03-30 DIAGNOSIS — R079 Chest pain, unspecified: Secondary | ICD-10-CM | POA: Insufficient documentation

## 2011-03-30 DIAGNOSIS — S20219A Contusion of unspecified front wall of thorax, initial encounter: Secondary | ICD-10-CM | POA: Insufficient documentation

## 2011-03-30 DIAGNOSIS — M25539 Pain in unspecified wrist: Secondary | ICD-10-CM | POA: Insufficient documentation

## 2011-03-30 DIAGNOSIS — S42213A Unspecified displaced fracture of surgical neck of unspecified humerus, initial encounter for closed fracture: Secondary | ICD-10-CM | POA: Insufficient documentation

## 2011-03-30 DIAGNOSIS — M25519 Pain in unspecified shoulder: Secondary | ICD-10-CM | POA: Insufficient documentation

## 2011-03-30 LAB — ETHANOL: Alcohol, Ethyl (B): 133 mg/dL — ABNORMAL HIGH (ref 0–11)

## 2011-04-02 ENCOUNTER — Other Ambulatory Visit: Payer: Self-pay | Admitting: Internal Medicine

## 2011-04-02 DIAGNOSIS — N62 Hypertrophy of breast: Secondary | ICD-10-CM

## 2011-04-02 DIAGNOSIS — R922 Inconclusive mammogram: Secondary | ICD-10-CM

## 2011-07-14 ENCOUNTER — Ambulatory Visit
Admission: RE | Admit: 2011-07-14 | Discharge: 2011-07-14 | Disposition: A | Discharge status: Home / Self Care | Payer: BC Managed Care – PPO | Source: Ambulatory Visit | Attending: Internal Medicine | Admitting: Internal Medicine

## 2011-07-14 DIAGNOSIS — N62 Hypertrophy of breast: Secondary | ICD-10-CM

## 2011-07-14 DIAGNOSIS — R922 Inconclusive mammogram: Secondary | ICD-10-CM

## 2011-07-22 ENCOUNTER — Other Ambulatory Visit: Payer: Self-pay | Admitting: Internal Medicine

## 2011-07-22 DIAGNOSIS — N6459 Other signs and symptoms in breast: Secondary | ICD-10-CM

## 2011-08-18 ENCOUNTER — Encounter: Payer: Self-pay | Admitting: Internal Medicine

## 2011-08-18 ENCOUNTER — Ambulatory Visit (INDEPENDENT_AMBULATORY_CARE_PROVIDER_SITE_OTHER): Payer: BC Managed Care – PPO | Admitting: Internal Medicine

## 2011-08-18 ENCOUNTER — Ambulatory Visit: Payer: BC Managed Care – PPO | Admitting: Internal Medicine

## 2011-08-18 ENCOUNTER — Encounter: Payer: Self-pay | Admitting: Neurology

## 2011-08-18 DIAGNOSIS — Z01118 Encounter for examination of ears and hearing with other abnormal findings: Secondary | ICD-10-CM

## 2011-08-18 DIAGNOSIS — H9319 Tinnitus, unspecified ear: Secondary | ICD-10-CM

## 2011-08-18 DIAGNOSIS — R42 Dizziness and giddiness: Secondary | ICD-10-CM

## 2011-08-18 DIAGNOSIS — H9390 Unspecified disorder of ear, unspecified ear: Secondary | ICD-10-CM
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
## 2011-08-18 NOTE — Patient Instructions (Signed)
Please keep a diary of symptoms and signs. Enter significant symptoms, frequency, severity ( 1-10 scale), possible triggers, and response to positional changes or other therapeutic intervention as discussed.

## 2011-09-29 ENCOUNTER — Ambulatory Visit: Payer: BC Managed Care – PPO | Admitting: Neurology

## 2011-10-02 ENCOUNTER — Telehealth: Payer: Self-pay | Admitting: Genetic Counselor

## 2011-10-07 NOTE — Telephone Encounter (Signed)
Left message

## 2011-10-15 ENCOUNTER — Ambulatory Visit
Admission: RE | Admit: 2011-10-15 | Discharge: 2011-10-15 | Disposition: A | Discharge status: Home / Self Care | Payer: BC Managed Care – PPO | Source: Ambulatory Visit | Attending: Internal Medicine | Admitting: Internal Medicine

## 2011-10-15 ENCOUNTER — Other Ambulatory Visit: Payer: Self-pay | Admitting: Internal Medicine

## 2011-10-15 DIAGNOSIS — N6459 Other signs and symptoms in breast: Secondary | ICD-10-CM

## 2011-10-19 ENCOUNTER — Encounter: Payer: Self-pay | Admitting: Internal Medicine

## 2011-10-19 DIAGNOSIS — Z86711 Personal history of pulmonary embolism: Secondary | ICD-10-CM | POA: Insufficient documentation

## 2011-10-19 DIAGNOSIS — Z86718 Personal history of other venous thrombosis and embolism: Secondary | ICD-10-CM | POA: Insufficient documentation

## 2011-10-19 DIAGNOSIS — Z8759 Personal history of other complications of pregnancy, childbirth and the puerperium: Secondary | ICD-10-CM | POA: Insufficient documentation

## 2011-10-22 ENCOUNTER — Ambulatory Visit: Payer: No Typology Code available for payment source | Admitting: Internal Medicine

## 2011-10-22 ENCOUNTER — Encounter: Payer: Self-pay | Admitting: Internal Medicine

## 2011-10-22 ENCOUNTER — Ambulatory Visit (INDEPENDENT_AMBULATORY_CARE_PROVIDER_SITE_OTHER): Payer: BC Managed Care – PPO | Admitting: Internal Medicine

## 2011-10-22 DIAGNOSIS — Z86711 Personal history of pulmonary embolism: Secondary | ICD-10-CM

## 2011-10-22 DIAGNOSIS — R042 Hemoptysis: Secondary | ICD-10-CM

## 2011-10-22 DIAGNOSIS — J209 Acute bronchitis, unspecified: Secondary | ICD-10-CM

## 2011-10-22 DIAGNOSIS — J069 Acute upper respiratory infection, unspecified: Secondary | ICD-10-CM

## 2011-10-22 DIAGNOSIS — Z86718 Personal history of other venous thrombosis and embolism: Secondary | ICD-10-CM

## 2011-10-22 MED ORDER — CLARITHROMYCIN ER 500 MG PO TB24: 1000.0000 mg | ORAL_TABLET | Freq: Every day | ORAL | Status: AC

## 2011-10-22 MED ORDER — HYDROCODONE-HOMATROPINE 5-1.5 MG/5ML PO SYRP: 5.0000 mL | ORAL_SOLUTION | Freq: Four times a day (QID) | ORAL | Status: AC | PRN

## 2011-10-22 NOTE — Progress Notes (Signed)
  Subjective:    Patient ID: Morgan Burch, female    DOB: 03-05-1961, 51 y.o.   MRN: [id number]  [facility]: Onset: 2/9  Trigger:no Course:dry initially; green sputum by 2/12 Treatment/efficacy:Allegra D, Mucinex DM  With minimal bendefit Associated symptoms/signs:  URI symptoms: No facial pain, frontal headaches,  dental pain.Clear - yellow nasal discharge; some sore hroat. No ear ache/otic discharge Extrinsic symptoms: No itchy eyes, sneezing, or angioedema Infectious symptoms: No fever, chills, sweats Chest symptoms: Some pleuritic pain. Streaky hemoptysis in purulent secretions. 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 The present symptoms do not mimic those events.         Review of Systems she does not have any insomnia or  significant sleep issues other than exogenous stress. The cough is disturbing her rest at night.     Objective:   Physical Exam General appearance:thin but in good health ;well nourished; no acute distress or increased work of breathing is present.  Some cervical lymphadenopathy w/o axillary LA.   Eyes: No conjunctival inflammation or lid edema is present. .  Ears:  External ear exam shows no significant lesions or deformities.  Otoscopic examination reveals clear canals, tympanic membranes are intact bilaterally without bulging, retraction, inflammation or discharge.  Nose:  External nasal examination shows no deformity or inflammation. Nasal mucosa are dry without lesions or exudates. No septal dislocation or deviation.No obstruction to airflow.   Oral exam: Dental hygiene is good; lips and gums are healthy appearing.There is no oropharyngeal erythema or exudate noted.     Heart:  Normal rate and regular rhythm. S1 and S2 normal without gallop, murmur, click, rub S 4 Lungs:Chest clear to auscultation; no wheezes, rhonchi,rales ,or rubs present.No increased work of breathing. Harsh paroxysmal cough with thick grayish green secretions  Extremities:  No cyanosis, edema, or clubbing  noted . Homans sign is negative   Skin: Warm & dry w/o jaundice or tenting.          Assessment & Plan:  #1 bronchitis, acute with streaky hemoptysis in purulent secretions. No reactive airways findings on exam  #2 upper respiratory tract infection without criteria for frank rhinosinusitis  #3 past medical history of deep venous diagnosis of pulmonary emboli; no clinical evidence of recurrence. Coagulopathy workup will be completed  Plan: See orders and recommendations

## 2011-10-22 NOTE — Patient Instructions (Signed)
Plain Mucinex for thick secretions ;force NON dairy fluids. Use a Neti pot daily as needed for sinus congestion Nose:  External nasal examination shows no deformity or inflammation. Nasal mucosa are pink and moist without lesions or exudates. No septal dislocation or dislocation.No obstruction to airflow.
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
## 2012-06-08 HISTORY — PX: TOTAL HIP ARTHROPLASTY: SHX124

## 2012-07-28 ENCOUNTER — Other Ambulatory Visit: Payer: Self-pay

## 2012-07-28 NOTE — Telephone Encounter (Signed)
Pt LMOVM requesting refill on Oxycodone and other meds stating had a partial hip replacement in [place] and was told to contact PCP for meds to be refilled. PLz advise pt CB: [phone]

## 2012-07-29 NOTE — Telephone Encounter (Signed)
Left message on voicemail with Dr.Hopper's response. I left fax number for patient to have records sent

## 2012-07-29 NOTE — Telephone Encounter (Signed)
Patient returned phone call. Best # [phone]

## 2012-07-29 NOTE — Telephone Encounter (Signed)
Dr.Hopper please advise for Oxycodone is not on patient's medication list. ? If patient will need to be seen

## 2012-07-29 NOTE — Telephone Encounter (Signed)
I reviewed the medical records; the last Orthopedic notes are from Dr. Thurston Hole in August. Unfortunately we need records faxed from [facility] or an OV as the narcotic  is not on her med list & I have never Rxed this.

## 2012-07-29 NOTE — Telephone Encounter (Signed)
Left  Message on voicemail for patient to return call when available

## 2012-07-30 MED ORDER — OXYCODONE-ACETAMINOPHEN 10-325 MG PO TABS: 1.0000 | ORAL_TABLET | Freq: Four times a day (QID) | ORAL | Status: DC | PRN

## 2012-07-30 NOTE — Telephone Encounter (Signed)
Spoke with patient, records received. Dr.Hopper reviewed and indicated ok to write rx #40/0 refills. Patient will stop by to pick up today

## 2012-07-30 NOTE — Telephone Encounter (Signed)
Left message on voicemail for patient to return call when available. Reason for call, verify patient received message

## 2012-07-30 NOTE — Telephone Encounter (Signed)
Patient called back and she filled out a Release of Information and will sent it to [facility] today.   Side Note: patient states once records received she will need rx for Oxycodone NOT Vicodin (tried this and it does not help)  [facility] Morgan Burch

## 2012-09-21 ENCOUNTER — Other Ambulatory Visit: Payer: Self-pay | Admitting: Internal Medicine

## 2012-09-21 DIAGNOSIS — N6459 Other signs and symptoms in breast: Secondary | ICD-10-CM

## 2012-10-11 ENCOUNTER — Encounter: Payer: Self-pay | Admitting: Gastroenterology

## 2012-10-15 ENCOUNTER — Other Ambulatory Visit: Payer: BC Managed Care – PPO

## 2012-10-26 ENCOUNTER — Ambulatory Visit (AMBULATORY_SURGERY_CENTER): Payer: BC Managed Care – PPO

## 2012-10-26 ENCOUNTER — Encounter: Payer: Self-pay | Admitting: Gastroenterology

## 2012-10-26 VITALS — Ht 64.0 in | Wt 140.0 lb

## 2012-10-26 DIAGNOSIS — Z1211 Encounter for screening for malignant neoplasm of colon: Secondary | ICD-10-CM

## 2012-11-05 ENCOUNTER — Ambulatory Visit
Admission: RE | Admit: 2012-11-05 | Discharge: 2012-11-05 | Disposition: A | Discharge status: Home / Self Care | Payer: BC Managed Care – PPO | Source: Ambulatory Visit | Attending: Internal Medicine | Admitting: Internal Medicine

## 2012-11-05 DIAGNOSIS — N6459 Other signs and symptoms in breast: Secondary | ICD-10-CM

## 2012-11-08 ENCOUNTER — Ambulatory Visit (AMBULATORY_SURGERY_CENTER): Payer: BC Managed Care – PPO | Admitting: Gastroenterology

## 2012-11-08 ENCOUNTER — Encounter: Payer: Self-pay | Admitting: Gastroenterology

## 2012-11-08 VITALS — BP 120/76 | HR 72 | Temp 97.2°F | Resp 14 | Ht 64.0 in | Wt 140.0 lb

## 2012-11-08 DIAGNOSIS — Z1211 Encounter for screening for malignant neoplasm of colon: Secondary | ICD-10-CM

## 2012-11-08 MED ORDER — SODIUM CHLORIDE 0.9 % IV SOLN: 500.0000 mL | INTRAVENOUS | Status: DC

## 2012-11-08 NOTE — Op Note (Signed)
[facility] [address] [place], [address]   COLONOSCOPY PROCEDURE REPORT  PATIENT: Morgan Burch, Morgan Burch  MR#: [id number] BIRTHDATE: Aug 14, 1961 , 51  yrs. old GENDER: Female ENDOSCOPIST: Rachael Fee, MD REFERRED OZ:[id number] Alwyn Ren, M.D. PROCEDURE DATE:  11/08/2012 PROCEDURE:   Colonoscopy, screening ASA CLASS:   Class II INDICATIONS:average risk screening. MEDICATIONS: Fentanyl 100 mcg IV, Versed 10 mg IV, and These medications were titrated to patient response per physician's verbal order  DESCRIPTION OF PROCEDURE:   After the risks benefits and alternatives of the procedure were thoroughly explained, informed consent was obtained.  A digital rectal exam revealed no abnormalities of the rectum.   The LB [id number]  endoscope was introduced through the anus and advanced to the cecum, which was identified by both the appendix and ileocecal valve. No adverse events experienced.   The quality of the prep was good, using MoviPrep  The instrument was then slowly withdrawn as the colon was fully examined.  COLON FINDINGS: A normal appearing cecum, ileocecal valve, and appendiceal orifice were identified.  The ascending, hepatic flexure, transverse, splenic flexure, descending, sigmoid colon and rectum appeared unremarkable.  No polyps or cancers were seen. Retroflexed views revealed no abnormalities. The time to cecum=4 minutes 20 seconds.  Withdrawal time=6 minutes 47 seconds.  The scope was withdrawn and the procedure completed. COMPLICATIONS: There were no complications.  ENDOSCOPIC IMPRESSION: Normal colon No polyps or cancers  RECOMMENDATIONS: You should continue to follow colorectal cancer screening guidelines for "routine risk" patients with a repeat colonoscopy in 10 years. There is no need for FOBT (stool) testing for at least 5 years.   eSigned:  Rachael Fee, MD 11/08/2012 10:55 AM

## 2012-11-08 NOTE — Patient Instructions (Signed)
YOU HAD AN ENDOSCOPIC PROCEDURE TODAY AT THE [facility]: Refer to the procedure report that was given to you for any specific questions about what was found during the examination.  If the procedure report does not answer your questions, please call your gastroenterologist to clarify.  If you requested that your care partner not be given the details of your procedure findings, then the procedure report has been included in a sealed envelope for you to review at your convenience later.  YOU SHOULD EXPECT: Some feelings of bloating in the abdomen. Passage of more gas than usual.  Walking can help get rid of the air that was put into your GI tract during the procedure and reduce the bloating. If you had a lower endoscopy (such as a colonoscopy or flexible sigmoidoscopy) you may notice spotting of blood in your stool or on the toilet paper. If you underwent a bowel prep for your procedure, then you may not have a normal bowel movement for a few days.  DIET: Your first meal following the procedure should be a light meal and then it is ok to progress to your normal diet.  A half-sandwich or bowl of soup is an example of a good first meal.  Heavy or fried foods are harder to digest and may make you feel nauseous or bloated.  Likewise meals heavy in dairy and vegetables can cause extra gas to form and this can also increase the bloating.  Drink plenty of fluids but you should avoid alcoholic beverages for 24 hours.  ACTIVITY: Your care partner should take you home directly after the procedure.  You should plan to take it easy, moving slowly for the rest of the day.  You can resume normal activity the day after the procedure however you should NOT DRIVE or use heavy machinery for 24 hours (because of the sedation medicines used during the test).    SYMPTOMS TO REPORT IMMEDIATELY: A gastroenterologist can be reached at any hour.  During normal business hours, 8:30 AM to 5:00 PM Monday through Friday,  call [phone].  After hours and on weekends, please call the GI answering service at [phone] who will take a message and have the physician on call contact you.   Following lower endoscopy (colonoscopy or flexible sigmoidoscopy):  Excessive amounts of blood in the stool  Significant tenderness or worsening of abdominal pains  Swelling of the abdomen that is new, acute  Fever of 100F or higher    FOLLOW UP: If any biopsies were taken you will be contacted by phone or by letter within the next 1-3 weeks.  Call your gastroenterologist if you have not heard about the biopsies in 3 weeks.  Our staff will call the home number listed on your records the next business day following your procedure to check on you and address any questions or concerns that you may have at that time regarding the information given to you following your procedure. This is a courtesy call and so if there is no answer at the home number and we have not heard from you through the emergency physician on call, we will assume that you have returned to your regular daily activities without incident.  SIGNATURES/CONFIDENTIALITY: You and/or your care partner have signed paperwork which will be entered into your electronic medical record.  These signatures attest to the fact that that the information above on your After Visit Summary has been reviewed and is understood.  Full responsibility of the confidentiality   of this discharge information lies with you and/or your care-partner.     

## 2012-11-08 NOTE — Progress Notes (Signed)
Patient did not experience any of the following events: a burn prior to discharge; a fall within the facility; wrong site/side/patient/procedure/implant event; or a hospital transfer or hospital admission upon discharge from the facility. (G8907) Patient did not have preoperative order for IV antibiotic SSI prophylaxis. (G8918) Patient did not have preoperative order for IV antibiotic SSI prophylaxis. (G8918)  

## 2012-11-08 NOTE — Progress Notes (Signed)
Discharge instructions reviewed with patient and caregiver. Instructed to not to use social media,text or email for several hours. Patient using cell phone during recovery time to speak with attorney regarding legal matters. Reinforced for patient to take care on making legal decisions under the influence of sedation. Patient stating" I am fine. Everyone is different."

## 2012-11-09 ENCOUNTER — Telehealth: Payer: Self-pay | Admitting: *Deleted

## 2012-11-09 NOTE — Telephone Encounter (Signed)
  Follow up Call-  Call back number 11/08/2012  Post procedure Call Back phone  # [phone]  Permission to leave phone message Yes     Patient questions:  Do you have a fever, pain , or abdominal swelling? no Pain Score  0 *  Have you tolerated food without any problems? no  Have you been able to return to your normal activities? yes  Do you have any questions about your discharge instructions: Diet   no Medications  no Follow up visit  no  Do you have questions or concerns about your Care? no  Actions: * If pain score is 4 or above: No action needed, pain <4. Pt has tolerated food and liquids well (disregard above question on that that answered no)

## 2012-12-29 ENCOUNTER — Telehealth: Payer: Self-pay | Admitting: Gastroenterology

## 2012-12-29 NOTE — Telephone Encounter (Signed)
Dr. Laurey Morale Office requested Consult Note from 11/08/2012 on 12/20/2012  Faxed Colon Report from 11/08/2012 on 12/27/2012  asw

## 2013-02-22 ENCOUNTER — Other Ambulatory Visit: Payer: Self-pay

## 2013-04-06 ENCOUNTER — Other Ambulatory Visit: Payer: Self-pay | Admitting: Internal Medicine

## 2013-04-06 ENCOUNTER — Other Ambulatory Visit: Payer: Self-pay | Admitting: Obstetrics and Gynecology

## 2013-04-06 DIAGNOSIS — N6310 Unspecified lump in the right breast, unspecified quadrant: Secondary | ICD-10-CM

## 2013-04-21 ENCOUNTER — Ambulatory Visit
Admission: RE | Admit: 2013-04-21 | Discharge: 2013-04-21 | Disposition: A | Discharge status: Home / Self Care | Payer: BC Managed Care – PPO | Source: Ambulatory Visit | Attending: Obstetrics and Gynecology | Admitting: Obstetrics and Gynecology

## 2013-04-21 DIAGNOSIS — N6310 Unspecified lump in the right breast, unspecified quadrant: Secondary | ICD-10-CM

## 2013-08-25 ENCOUNTER — Telehealth: Payer: Self-pay | Admitting: *Deleted

## 2013-08-25 NOTE — Telephone Encounter (Signed)
Because of the difficulty walking after falling; I recommend reevaluation by the orthopedist. If they do not issue handicapped card for her; we can do that after that evaluation.

## 2013-08-25 NOTE — Telephone Encounter (Signed)
Patient called and stated that she fell with no injuries, but it is hard for her to walk and she is using her walker again. She would like a handicap placard. Please advise.

## 2013-08-26 NOTE — Telephone Encounter (Signed)
Called and left message for patient to return call.  

## 2013-08-26 NOTE — Telephone Encounter (Signed)
   I am very sorry that I have no records to document that a handicap placard is necessary. Last office visit was February 2013 for bronchitis.  Options are to either come in for an office visit or obtain  the documentation from the [facility] orthopedist. Again I am sorry.

## 2013-08-26 NOTE — Telephone Encounter (Signed)
Patient stated the her ortho doctor is in [place] and she is not able to see him at this time. She would like for [place] to supply her with the handicap placard if able. JG//CMA

## 2013-08-30 ENCOUNTER — Ambulatory Visit: Payer: BC Managed Care – PPO | Admitting: Internal Medicine

## 2013-09-02 ENCOUNTER — Encounter: Payer: Self-pay | Admitting: Internal Medicine

## 2013-09-02 ENCOUNTER — Ambulatory Visit (INDEPENDENT_AMBULATORY_CARE_PROVIDER_SITE_OTHER): Payer: BC Managed Care – PPO | Admitting: Internal Medicine

## 2013-09-02 VITALS — BP 120/81 | HR 65 | Temp 98.3°F | Resp 16 | Wt 130.0 lb

## 2013-09-02 DIAGNOSIS — T7411XA Adult physical abuse, confirmed, initial encounter: Secondary | ICD-10-CM | POA: Insufficient documentation

## 2013-09-02 DIAGNOSIS — Z96649 Presence of unspecified artificial hip joint: Secondary | ICD-10-CM

## 2013-09-02 DIAGNOSIS — T788XXS Other adverse effects, not elsewhere classified, sequela: Secondary | ICD-10-CM

## 2013-09-02 DIAGNOSIS — M25559 Pain in unspecified hip: Secondary | ICD-10-CM

## 2013-09-02 DIAGNOSIS — G8929 Other chronic pain: Secondary | ICD-10-CM

## 2013-09-02 DIAGNOSIS — T7411XS Adult physical abuse, confirmed, sequela: Secondary | ICD-10-CM

## 2013-09-02 DIAGNOSIS — Z86718 Personal history of other venous thrombosis and embolism: Secondary | ICD-10-CM

## 2013-09-02 NOTE — Patient Instructions (Signed)
I recommend an  Orthopedic  consultation to determine optimal long term therapy.

## 2013-09-02 NOTE — Progress Notes (Signed)
   Subjective:    Patient ID: Morgan Burch, female    DOB: 1961-08-17, 52 y.o.   MRN: [id number]  HPI   The problem list was updated with the history pertinent to her application for handicap parking. 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 She received PT/OT for 2 months following the surgery. She is able walk only 50 yards before she must stop due to the pain.  Perioperatively she was covered with Lovenox for deep venous thrombosis prophylaxis. She has a  history of deep venous thrombosis while on birth control pills in the  remote past.    Review of Systems  She has tended to fall repeatedly when she rushes, especially on stairs  She will establish with her prior orthopedist here once she gets on Medicaid.     Objective:   Physical Exam  Gen.:  well-nourished in appearance. Alert, appropriate and cooperative throughout exam. Head: Normocephalic without obvious abnormalities Eyes: No corneal or conjunctival inflammation noted. Pupils equal round reactive to light and accommodation. Extraocular motion intact. Neck: No deformities, masses, or tenderness noted. Range of motion decreased to L. Lungs: Normal respiratory effort; chest expands symmetrically. Lungs are clear to auscultation without rales, wheezes, or increased work of breathing. Heart: Normal rate and rhythm. Normal S1 and S2. No gallop, click, or rub. S4 w/o  murmur.                                Musculoskeletal/extremities: No deformity or scoliosis noted of  the thoracic or lumbar spine.  Slight deficit L superior deltoid No clubbing, cyanosis, or edema noted. Range of motion normal .Tone & strength normal. Hand joints normal . Fingernail health good. Neurologic: Alert and oriented x3. Deep tendon reflexes symmetrical and normal.  Gait with limp on L      Skin: Intact without suspicious lesions or rashes. Psych: Tearful but normally  interactive                                                                                        Assessment & Plan:  #1 chronic L hip pain Form completed

## 2014-02-09 ENCOUNTER — Other Ambulatory Visit: Payer: Self-pay

## 2014-02-09 DIAGNOSIS — Z1231 Encounter for screening mammogram for malignant neoplasm of breast: Secondary | ICD-10-CM

## 2014-03-14 ENCOUNTER — Ambulatory Visit
Admission: RE | Admit: 2014-03-14 | Discharge: 2014-03-14 | Disposition: A | Discharge status: Home / Self Care | Payer: BC Managed Care – PPO | Source: Ambulatory Visit

## 2014-03-14 DIAGNOSIS — Z1231 Encounter for screening mammogram for malignant neoplasm of breast: Secondary | ICD-10-CM

## 2014-03-16 ENCOUNTER — Other Ambulatory Visit: Payer: Self-pay | Admitting: Obstetrics and Gynecology

## 2014-03-16 DIAGNOSIS — R928 Other abnormal and inconclusive findings on diagnostic imaging of breast: Secondary | ICD-10-CM

## 2014-05-12 ENCOUNTER — Ambulatory Visit
Admission: RE | Admit: 2014-05-12 | Discharge: 2014-05-12 | Disposition: A | Discharge status: Home / Self Care | Payer: BC Managed Care – PPO | Source: Ambulatory Visit | Attending: Obstetrics and Gynecology | Admitting: Obstetrics and Gynecology

## 2014-05-12 DIAGNOSIS — R928 Other abnormal and inconclusive findings on diagnostic imaging of breast: Secondary | ICD-10-CM

## 2015-04-10 ENCOUNTER — Other Ambulatory Visit: Payer: Self-pay

## 2015-04-10 ENCOUNTER — Ambulatory Visit
Admission: RE | Admit: 2015-04-10 | Discharge: 2015-04-10 | Disposition: A | Discharge status: Home / Self Care | Payer: 59 | Source: Ambulatory Visit

## 2015-04-10 DIAGNOSIS — Z1231 Encounter for screening mammogram for malignant neoplasm of breast: Secondary | ICD-10-CM

## 2022-12-17 ENCOUNTER — Encounter: Payer: Self-pay | Admitting: Gastroenterology
